# Patient Record
Sex: Female | Born: 2005
Health system: Southern US, Community
[De-identification: ages and names within clinical notes are randomized; demographics above are authoritative.]

## PROBLEM LIST (undated history)

## (undated) DIAGNOSIS — F909 Attention-deficit hyperactivity disorder, unspecified type: Secondary | ICD-10-CM

## (undated) DIAGNOSIS — J45909 Unspecified asthma, uncomplicated: Secondary | ICD-10-CM

---
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
## 2023-09-08 NOTE — Progress Notes (Signed)
 Hematology/Oncology Follow-up Visit   Patient Name:  Stacy Sims  Date of Birth:  [birth year]  Date of Encounter:  09/11/2023     Referring Provider:  Annis Lauraine Lamer,*,  [phone]    PCP:  Annis Lauraine Lamer, PA-C       Diagnoses: Iron Deficiency Anemia, Hemoglobin C        Hematologic/Oncologic History:  Stacy Sims is a 18 y.o. with  Iron deficiency anemia.She denies a family history of blood issues. The patient mother attends visits. Her mother reports that the patient has the sickle cell trait. She confirms she takes oral iron. Patient has hemoglobin C.       Interim Note:    [name] returns today for follow up of Iron Deficiency Anemia. She is feeling good today The patient denies any symptoms such as fever, chills, night sweats, chest pain, shortness of breath, constipation, or diarrhea. Her ECOG performance status is 0. Patient reports that her periods are about the same. No new concerns to report today.     Review of Systems:  All other systems are negative.   Current Outpatient Medications  Medication Sig Dispense Refill  . cetirizine  (CHILDREN'S ALL-DAY ALLERGY) 5 mg/5 mL soln Take 10 mg by mouth every evening. 120 mL 0  . ferrous sulfate 325 mg (65 mg iron) tablet Take 325 mg by mouth 2 (two) times a day.    . ibuprofen (MOTRIN) 100 mg/5 mL suspension Take  by mouth.    . triamcinolone (KENALOG) 0.1 % cream Apply to affected areas twice daily (Patient not taking: Reported on 07/10/2023) 4,536 g 2   No current facility-administered medications for this visit.     Past medical history, allergies, current medications, social history and family history were reviewed and updated when appropriate.    Physical Examination:  Vital Signs: There were no vitals taken for this visit.  General:  Pleasant female in no acute distress  HEENT: Normocephalic, atraumatic. PERRLA, EOMI, sclera anicteric.  Nose without discharge.  Mouth and lips showed no  lesions; oral mucosa is moist.  Neck supple without masses.  Lymphatic/Immunologic:  No cervical, axillary, or femoral adenopathy.   Cardiovascular:  Regular rate and rhythm, no audible murmurs, gallops, or rubs.  No JVD noted on examination.    Respiratory:  Chest clear to percussion and auscultation bilaterally; no respiratory distress. Patient speaks in complete sentences.  Gastrointestinal:  Abdomen soft and without tenderness; no hepatosplenomegaly or other masses. No clinical ascites.  Extremities:  No edema or suspicious rashes.   Skin:  No pathologic appearing petechiae or bruising noted.  Neurologic: Alert and oriented to person, place, time and circumstance.  Strength and sensation are grossly intact.  Cranial nerves III through XII grossly intact.   Psychiatric:    Mood and affect are normal. Speech is fluent.    Results:   Results for orders placed or performed in visit on 09/11/23  CBC with Differential   Collection Time: 09/11/23 12:37 PM  Result Value Ref Range   WBC 8.92 4.40 - 11.00 10*3/uL   RBC 5.65 (H) 4.10 - 5.10 10*6/uL   Hemoglobin 12.7 12.3 - 15.3 g/dL   Hematocrit 61.0 64.0 - 44.6 %   Mean Corpuscular Volume (MCV) 69.0 (L) 80.0 - 96.0 fL   Mean Corpuscular Hemoglobin (MCH) 22.5 (L) 27.5 - 33.2 pg   Mean Corpuscular Hemoglobin Conc (MCHC) 32.6 (L) 33.0 - 37.0 g/dL   Red Cell Distribution Width (RDW)  17.7 (H) 12.3 - 17.0 %   Platelet Count (PLT) 267 150 - 450 10*3/uL   Mean Platelet Volume (MPV) 10.0 6.8 - 10.2 fL   Neutrophils % 52 %   Lymphocytes % 40 %   Monocytes % 5 %   Eosinophils % 2 %   Basophils % 1 %   Neutrophils Absolute 4.60 1.80 - 7.80 10*3/uL   Lymphocytes # 3.60 1.00 - 4.80 10*3/uL   Monocytes # 0.40 0.00 - 0.80 10*3/uL   Eosinophils # 0.20 0.00 - 0.50 10*3/uL   Basophils # 0.10 0.00 - 0.20 10*3/uL        Assessment and Plan:    Iron Deficiency Anemia - Hgb was 10.0, Ferritin was 24 with an iron percent saturation of  5% on 02/21/2022.  Patient has hemoglobin C. Continue to take iron twice daily.   Hgb is 12.7 today. Checking iron stores today.     Reviewed the significance of lab findings and she has a better understanding of it.  Family was with her to discuss her situation as well and have a good understanding of when it would be appropriate to get IV iron versus what she is right now and when  she feels quite well. Her hemoglobin is relatively maintained.      RTC 6 months, she can call earlier if she starts feeling fatigued.    In total, 20 minutes of time was spent on this encounter, greater than 50% of which was spent face-to-face with  Shemia R Flater for the history, physical exam, assessment and formulation of treatment plan.   This document serves as a record of services personally performed by Aida Lunger, MD. It was created on their behalf by Eveleen Elsie Hue, Scribe, a trained medical scribe. The creation of this record is the provider's dictation and/or activities during the visit.   Electronically signed by: Eveleen Elsie Hue, Scribe 09/11/2023 1:23 PM  I agree the documentation is accurate and complete.  Electronically signed by: Aida JONELLE Lunger, MD 09/30/2023 1:18 PM

## 2023-10-27 ENCOUNTER — Emergency Department (HOSPITAL_BASED_OUTPATIENT_CLINIC_OR_DEPARTMENT_OTHER)
Admission: EM | Admit: 2023-10-27 | Discharge: 2023-10-27 | Disposition: A | Discharge status: Home / Self Care | Attending: Emergency Medicine | Admitting: Emergency Medicine

## 2023-10-27 ENCOUNTER — Encounter (HOSPITAL_BASED_OUTPATIENT_CLINIC_OR_DEPARTMENT_OTHER): Payer: Self-pay | Admitting: *Deleted

## 2023-10-27 ENCOUNTER — Emergency Department (HOSPITAL_BASED_OUTPATIENT_CLINIC_OR_DEPARTMENT_OTHER)

## 2023-10-27 ENCOUNTER — Other Ambulatory Visit: Payer: Self-pay

## 2023-10-27 DIAGNOSIS — J0110 Acute frontal sinusitis, unspecified: Secondary | ICD-10-CM | POA: Diagnosis not present

## 2023-10-27 DIAGNOSIS — R059 Cough, unspecified: Secondary | ICD-10-CM | POA: Diagnosis present

## 2023-10-27 LAB — RESP PANEL BY RT-PCR (RSV, FLU A&B, COVID)  RVPGX2
Influenza A by PCR: NEGATIVE
Influenza B by PCR: NEGATIVE
Resp Syncytial Virus by PCR: NEGATIVE
SARS Coronavirus 2 by RT PCR: NEGATIVE

## 2023-10-27 MED ORDER — AMOXICILLIN-POT CLAVULANATE 875-125 MG PO TABS: 1.0000 | ORAL_TABLET | Freq: Two times a day (BID) | ORAL | 0 refills | Status: AC

## 2023-10-27 NOTE — Discharge Instructions (Signed)
 Return if any problems.

## 2023-10-27 NOTE — ED Triage Notes (Signed)
 Here by POV from home for cough, describes as productive with mucus, noticed blood, ongoing > 2 weeks, works in daycare. Reports ILI 2 weeks ago, sx resolved but cough lingers. Denies fever, NVD, or pain.

## 2023-10-27 NOTE — ED Provider Notes (Signed)
 [place] [facility] AT [facility] Provider Note   CSN: [id number] Arrival date & time: 10/27/23  1731     History  Chief Complaint  Patient presents with   Cough    Stacy Sims is a 18 y.o. female.  Patient reports that she had a cold and congestion 2 weeks ago.  Patient reports that she went to the beach and symptoms resolved.  Patient reports for the past week she has had increased sinus congestion and pressure.  Patient reports her nose has been draining.  Patient denies any chest congestion.  Patient has had limited coughing.  Patient denies any fever or chills  The history is provided by the patient. No language interpreter was used.  Cough Cough characteristics:  Non-productive      Home Medications Prior to Admission medications   Medication Sig Start Date End Date Taking? Authorizing Provider  amoxicillin-clavulanate (AUGMENTIN) 875-125 MG tablet Take 1 tablet by mouth 2 (two) times daily. 10/27/23  Yes Merlean Pizzini K, PA-C  amoxicillin-clavulanate (AUGMENTIN) 875-125 MG tablet Take 1 tablet by mouth 2 (two) times daily. 10/27/23  Yes Dorothey Gate K, PA-C  albuterol  (PROVENTIL  HFA;VENTOLIN  HFA) 108 (90 Base) MCG/ACT inhaler Inhale 2 puffs into the lungs every 6 (six) hours as needed. For shortness of breath 03/05/18   Stallings, Zoe A, MD  Cetirizine HCl (ZYRTEC) 5 MG/5ML SYRP Take 5 mg by mouth daily.    [provider]      Allergies    Patient has no known allergies.    Review of Systems   Review of Systems  Respiratory:  Positive for cough.   All other systems reviewed and are negative.   Physical Exam Updated Vital Signs BP 117/75   Pulse 77   Temp 99.1 F (37.3 C) (Oral)   Resp 18   Wt 104.3 kg   LMP 09/21/2023 (Exact Date)   SpO2 100%  Physical Exam Vitals reviewed.  Constitutional:      Appearance: Normal appearance.  HENT:     Head: Normocephalic and atraumatic.     Right Ear: External ear normal.      Left Ear: External ear normal.     Nose: Nose normal.     Mouth/Throat:     Mouth: Mucous membranes are moist.  Cardiovascular:     Rate and Rhythm: Normal rate.  Pulmonary:     Effort: Pulmonary effort is normal.  Abdominal:     General: Abdomen is flat.  Musculoskeletal:        General: Normal range of motion.  Skin:    General: Skin is warm.  Neurological:     General: No focal deficit present.     Mental Status: She is alert.  Psychiatric:        Mood and Affect: Mood normal.     ED Results / Procedures / Treatments   Labs (all labs ordered are listed, but only abnormal results are displayed) Labs Reviewed  RESP PANEL BY RT-PCR (RSV, FLU A&B, COVID)  RVPGX2    EKG None  Radiology No results found.  Procedures Procedures    Medications Ordered in ED Medications - No data to display  ED Course/ Medical Decision Making/ A&P                                 Medical Decision Making Patient complains of sinus pressure and congestion.  Patient was  sick 2 weeks ago with cough and congestion she thought she was getting better now she has increased sinus pressure and drainage  Risk Prescription drug management. Risk Details: Patient counseled on sinus infection she is given a prescription for Augmentin.  Patient is advised to follow-up with her primary care physician for recheck.           Final Clinical Impression(s) / ED Diagnoses Final diagnoses:  Acute frontal sinusitis, recurrence not specified    Rx / DC Orders ED Discharge Orders          Ordered    amoxicillin-clavulanate (AUGMENTIN) 875-125 MG tablet  2 times daily        10/27/23 1933    amoxicillin-clavulanate (AUGMENTIN) 875-125 MG tablet  2 times daily        10/27/23 1937           An After Visit Summary was printed and given to the patient.    Sandi Crosby, PA-C 10/27/23 2323    Afton Horse T, DO 10/30/23 [phone]

## 2023-12-22 NOTE — Progress Notes (Signed)
 Patient presents for  Chief Complaint  Patient presents with  . Possible URI  . Follow-up   Stacy Sims presents to primary care clinic for ongoing management of Possible URI  SUBJECTIVE   Pt is present addressing possible URI symptoms x 3 days.  Symptoms:Cough/tickle,sore throat,slight runny nose Denies:Nausea,dizziness,vomiting,diarrhea, body aches, fever,headache,wheezing,SOB,postnasal drainage,sinus pressure,coughing up mucus of any kind/color,fatigue,head/nasal congestion. Duration:3 Days Medications Tried: Zyrtec ,Bromphen/Pseudo/Dext RO HBR syrup 2-30-10 mg/5 ml syrp Ill Contacts: Possible-Works at [employer] Pulmonary Disease: Mild Intermittent Asthma Without Complication Tobacco Use :   No History Of Seasonal Allergies,History of Chronic Sinusitis Pt declines additional testing at this time.  New Problems/Concerns:  None at this time.  Allergies[1]  Current Medications[2]  The following portions of the patient's history were reviewed and updated as appropriate: allergies, current medications, past medical history, past social history, past family history, and problem list.   ROS   See HPI.  OBJECTIVE  BP 128/82 (BP Location: Left arm, Patient Position: Sitting)   Pulse 84   Temp 100 F (37.8 C) (Oral)   Ht 1.715 m (5' 7.5)   Wt 105 kg (231 lb 9.6 oz)   SpO2 98%   BMI 35.74 kg/m   GENERAL APPEARANCE: Well appearing, well developed, no acute distress. HEENT:  Conjunctiva without erythema or drainage. External auditory canals are non-swollen without exudate present. Tympanic membranes are normal. Nasal turbinates are without edema or drainage. Oropharynx is without lesion or exudate.       NECK: Supple without lymphadenopathy, bruit, or thyromegaly. LUNGS: Clear to auscultation bilaterally HEART: Regular rate and rhythm without murmur. EXTREMITIES: No edema NEUROLOGIC: Alert and oriented x3, no obvious deficits.  SKIN:  No rashes or abnormal lesions  visible.   ASSESSMENT/PLAN   1. Pharyngitis, unspecified etiology (Primary) Due to duration and severity of symptoms, suspect viral URI, less likely bacterial.  Will provide symptomatic treatment with prednisone taper and tessalon  Encouraged rest, increased fluid intake, Mucinex/flonase for congestion and Tylenol  for pain/fevers.  Contact the office if symptoms change or worsen may consider antibiotics at that time. Explained that cough may linger for up to 2 weeks.  - predniSONE (DELTASONE) 10 mg tablet; Take 6 tablets PO day 1, 5 tablets day 2, 4 tablets day 3, 3 tablets day 4, 2 tablets day 5, 1 tablet day 6  Dispense: 21 tablet; Refill: 0 - benzonatate (TESSALON) 200 mg capsule; Take 1 capsule (200 mg total) by mouth 3 (three) times a day as needed for cough.  Dispense: 30 capsule; Refill: 0  Follow up if symptoms fail to improve or worsen.   This document serves as a record of services personally performed by Odis Petite, FNP.  It was created on their behalf by Kenney JONELLE Diver, CMA, a trained medical scribe, and Certified Medical Assistant (CMA). During the course of documenting the history, physical exam and medical decision making, I was functioning as a Stage manager. The creation of this record is the provider's dictation and/or activities during the visit.  Electronically signed by Kenney JONELLE Diver, CMA 12/22/2023 2:51 PM  I agree the documentation is accurate and complete.   Electronically signed by: Morene Eck Roddy, FNP [place] 12/22/2023 2:52 PM       [1] No Known Allergies [2]  Current Outpatient Medications:  .  cetirizine  (CHILDREN'S ALL-DAY ALLERGY) 5 mg/5 mL soln, Take 10 mg by mouth every evening., Disp: 120 mL, Rfl: 0 .  ferrous sulfate 325 mg (65 mg iron) tablet, Take 325 mg  by mouth 2 (two) times a day., Disp: , Rfl:  .  ibuprofen (MOTRIN) 100 mg/5 mL suspension, Take  by mouth., Disp: , Rfl:  .  triamcinolone (KENALOG) 0.1 % cream, Apply to affected areas  twice daily, Disp: 4,536 g, Rfl: 2 .  benzonatate (TESSALON) 200 mg capsule, Take 1 capsule (200 mg total) by mouth 3 (three) times a day as needed for cough., Disp: 30 capsule, Rfl: 0 .  predniSONE (DELTASONE) 10 mg tablet, Take 6 tablets PO day 1, 5 tablets day 2, 4 tablets day 3, 3 tablets day 4, 2 tablets day 5, 1 tablet day 6, Disp: 21 tablet, Rfl: 0

## 2023-12-23 ENCOUNTER — Other Ambulatory Visit: Payer: Self-pay

## 2023-12-23 ENCOUNTER — Emergency Department (HOSPITAL_BASED_OUTPATIENT_CLINIC_OR_DEPARTMENT_OTHER)
Admission: EM | Admit: 2023-12-23 | Discharge: 2023-12-23 | Disposition: A | Discharge status: Home / Self Care | Attending: Emergency Medicine | Admitting: Emergency Medicine

## 2023-12-23 ENCOUNTER — Emergency Department (HOSPITAL_BASED_OUTPATIENT_CLINIC_OR_DEPARTMENT_OTHER)

## 2023-12-23 ENCOUNTER — Encounter (HOSPITAL_BASED_OUTPATIENT_CLINIC_OR_DEPARTMENT_OTHER): Payer: Self-pay | Admitting: Emergency Medicine

## 2023-12-23 DIAGNOSIS — J069 Acute upper respiratory infection, unspecified: Secondary | ICD-10-CM | POA: Insufficient documentation

## 2023-12-23 DIAGNOSIS — R059 Cough, unspecified: Secondary | ICD-10-CM | POA: Diagnosis present

## 2023-12-23 LAB — RESP PANEL BY RT-PCR (RSV, FLU A&B, COVID)  RVPGX2
Influenza A by PCR: NEGATIVE
Influenza B by PCR: NEGATIVE
Resp Syncytial Virus by PCR: NEGATIVE
SARS Coronavirus 2 by RT PCR: NEGATIVE

## 2023-12-23 MED ORDER — CETIRIZINE HCL 10 MG PO TABS: 10.0000 mg | ORAL_TABLET | Freq: Every day | ORAL | 1 refills | Status: AC

## 2023-12-23 NOTE — ED Triage Notes (Signed)
 Cough x 1 week saw the dr yesterday given meds but not antibioticsm states cough is worse at night had a sore throat but now it is gone

## 2023-12-23 NOTE — Discharge Instructions (Signed)
 1.  Continue to use your inhaler every 4-6 hours as needed for cough or wheeze.  You may use this before you go to sleep at night.  Asthma often causes more cough and wheezing at nighttime.  You should follow-up with your family doctor for additional asthma testing.  If you have chronic asthma, you may need regular daily medications to control symptoms.  Albuterol  is helpful for flareups but chronic asthma benefits from daily treatment.  You do not have wheezing at the time of your evaluation today, however symptoms such as chronic nighttime cough or cough with exercise can be due to asthma. 2.  Finish the steroids as prescribed from urgent care.  You can continue to take over-the-counter nighttime cough and cold medications as needed carefully following package instructions and not combining medications. 3.  Schedule a follow-up with your family doctor.  Return to the emergency department if you develop a fever, chest pain, shortness of breath or other concerning symptoms.

## 2023-12-23 NOTE — ED Notes (Signed)
 Patient transported to X-ray

## 2023-12-23 NOTE — ED Provider Notes (Signed)
 [place] [facility] Provider Note   CSN: [id number] Arrival date & time: 12/23/23  9247     Patient presents with: Cough   Stacy Sims is a 18 y.o. female.   HPI Patient reports she has a sore throat that started about a week ago.  Initially she had some pain with swallowing.  Then she started to develop a cough.  Patient reports she does not think she has had a fever with the symptoms.  She reports that the sore throat has improved and she was mostly having with cough but now that seems to be about gone.  The symptom that is really persisting is nighttime cough.  Patient reports that all of the symptoms get worse at night.  They seem to be improving during the day.  Patient reports she does have a history of asthma but was unaware that she could use the inhaler for wheezing.  If she has not tried the inhaler at nighttime.  She reports now that she is aware that she will be trying it.  Patient denies any chest pain or abdominal pain.  No lower extremity swelling or calf pain.    Prior to Admission medications   Medication Sig Start Date End Date Taking? Authorizing Provider  cetirizine  (ZYRTEC  ALLERGY) 10 MG tablet Take 1 tablet (10 mg total) by mouth daily. 12/23/23  Yes Armenta Canning, MD  albuterol  (PROVENTIL  HFA;VENTOLIN  HFA) 108 (90 Base) MCG/ACT inhaler Inhale 2 puffs into the lungs every 6 (six) hours as needed. For shortness of breath 03/05/18   Stallings, Zoe A, MD  amoxicillin -clavulanate (AUGMENTIN ) 875-125 MG tablet Take 1 tablet by mouth 2 (two) times daily. 10/27/23   Sofia, Leslie K, PA-C  amoxicillin -clavulanate (AUGMENTIN ) 875-125 MG tablet Take 1 tablet by mouth 2 (two) times daily. 10/27/23   Sofia, Leslie K, PA-C  Cetirizine  HCl (ZYRTEC ) 5 MG/5ML SYRP Take 5 mg by mouth daily.    [provider]    Allergies: Patient has no known allergies.    Review of Systems  Updated Vital Signs BP 132/68 (BP Location: Right Arm)    Pulse 92   Temp 99.1 F (37.3 C) (Oral)   Resp 20   Ht 5' 7 (1.702 m)   Wt 104.3 kg   SpO2 100%   BMI 36.02 kg/m   Physical Exam Constitutional:      Comments: Patient is alert and nontoxic.  No respiratory distress.  Mental status clear.  HENT:     Right Ear: Tympanic membrane normal.     Left Ear: Tympanic membrane normal.     Nose: Nose normal.     Mouth/Throat:     Mouth: Mucous membranes are moist.     Pharynx: Oropharynx is clear.     Comments: Posterior oropharynx is widely patent.  There is no erythema or exudate on the tonsillar pillars.  Uvula is midline. Eyes:     Extraocular Movements: Extraocular movements intact.     Pupils: Pupils are equal, round, and reactive to light.  Cardiovascular:     Rate and Rhythm: Normal rate and regular rhythm.  Pulmonary:     Effort: Pulmonary effort is normal.     Breath sounds: Normal breath sounds.  Abdominal:     General: There is no distension.     Palpations: Abdomen is soft.     Tenderness: There is no abdominal tenderness. There is no guarding.  Musculoskeletal:        General:  No swelling or tenderness. Normal range of motion.     Cervical back: Neck supple.     Right lower leg: No edema.     Left lower leg: No edema.     Comments: No calf tenderness to compression.  No peripheral edema.  Skin:    General: Skin is warm and dry.  Neurological:     General: No focal deficit present.     Mental Status: She is oriented to person, place, and time.     Motor: No weakness.     Coordination: Coordination normal.  Psychiatric:        Mood and Affect: Mood normal.     (all labs ordered are listed, but only abnormal results are displayed) Labs Reviewed  RESP PANEL BY RT-PCR (RSV, FLU A&B, COVID)  RVPGX2    EKG: None  Radiology: DG Chest 2 View Result Date: 12/23/2023 CLINICAL DATA:  Cough for 1 week. EXAM: CHEST - 2 VIEW COMPARISON:  06/08/2011 FINDINGS: The heart size and mediastinal contours are within  normal limits. Both lungs are clear. The visualized skeletal structures are unremarkable. IMPRESSION: No active cardiopulmonary disease. Electronically Signed   By: Waddell Calk M.D.   On: 12/23/2023 08:57     Procedures   Medications Ordered in the ED - No data to display                                  Medical Decision Making Amount and/or Complexity of Data Reviewed Radiology: ordered.  Risk OTC drugs.   Patient has had about a weeks worth of symptoms starting with sore throat and then followed by cough with wheezing.  Patient reports a history of asthma but is controlled with occasional use of inhalers.  At this time she is clinically well in appearance with a normal physical exam.  Patient reports she is seeing improvement of her symptoms but they recrudesce and are much more intense at night when she is trying to sleep.  Patient was seen yesterday and started empirically on prednisone and Tessalon pearls.  Patient was also counseled on increased use of her inhaler.  Patient reports that she did get the prednisone but opted not to pay for the [facility], so has not had the option to try them.  Will obtain chest x-ray for cough of approximately week to rule out any appearance of pneumonia.  Chest x-ray reviewed by radiology negative.  Respiratory swab for COVID influenza RSV negative.  Patient is clinically well at this time.  She does not have active wheezing on exam today.  Presentation continues to be consistent with viral illness.  Patient does report a history of asthma.  We discussed asthma as a potential cause for nighttime cough.  It does sound as though symptoms have been exacerbated by acute illness although patient is clinically well.  She was prescribed prednisone from urgent care which I advised to continue.  Patient was not aware that she can use the inhaler at night for nighttime cough.  I have refilled Zyrtec  for the patient.  Recommendations include further  evaluation by PCP for asthma in case nighttime cough is asthmatic rather than viral.  We also reviewed return precautions for symptoms of any type of bacterial illness including chest pain, fever, shortness of breath.  Patient voices understanding.     Final diagnoses:  Viral URI with cough    ED Discharge Orders  Ordered    cetirizine  (ZYRTEC  ALLERGY) 10 MG tablet  Daily        12/23/23 9061               Armenta Canning, MD 12/23/23 [phone]

## 2024-03-08 ENCOUNTER — Other Ambulatory Visit: Payer: Self-pay

## 2024-03-08 ENCOUNTER — Emergency Department (HOSPITAL_BASED_OUTPATIENT_CLINIC_OR_DEPARTMENT_OTHER)
Admission: EM | Admit: 2024-03-08 | Discharge: 2024-03-08 | Disposition: A | Discharge status: Home / Self Care | Attending: Emergency Medicine | Admitting: Emergency Medicine

## 2024-03-08 ENCOUNTER — Encounter (HOSPITAL_BASED_OUTPATIENT_CLINIC_OR_DEPARTMENT_OTHER): Payer: Self-pay | Admitting: Emergency Medicine

## 2024-03-08 DIAGNOSIS — J069 Acute upper respiratory infection, unspecified: Secondary | ICD-10-CM | POA: Insufficient documentation

## 2024-03-08 DIAGNOSIS — R059 Cough, unspecified: Secondary | ICD-10-CM | POA: Diagnosis present

## 2024-03-08 LAB — RESP PANEL BY RT-PCR (RSV, FLU A&B, COVID)  RVPGX2
Influenza A by PCR: NEGATIVE
Influenza B by PCR: NEGATIVE
Resp Syncytial Virus by PCR: NEGATIVE
SARS Coronavirus 2 by RT PCR: NEGATIVE

## 2024-03-08 LAB — GROUP A STREP BY PCR: Group A Strep by PCR: NOT DETECTED

## 2024-03-08 MED ORDER — AMOXICILLIN-POT CLAVULANATE 875-125 MG PO TABS: 1.0000 | ORAL_TABLET | Freq: Two times a day (BID) | ORAL | 0 refills | Status: AC

## 2024-03-08 MED ORDER — AMOXICILLIN-POT CLAVULANATE 875-125 MG PO TABS
1.0000 | ORAL_TABLET | Freq: Once | ORAL | Status: AC
  Administered 2024-03-08: 1 via ORAL
  Filled 2024-03-08: qty 1

## 2024-03-08 NOTE — ED Provider Notes (Signed)
 [place] [facility] Provider Note   CSN: [id number] Arrival date & time: 03/08/24  1725     Patient presents with: URI and Sore Throat   Stacy Sims is a 18 y.o. female who presents emergency department with a chief complaint of URI symptoms.  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  3 weeks ago she developed URI symptoms including runny nose cough sore throat.  She states that she was getting better but she suddenly got sick again in the last 24 hours.  She denies any fevers or chills.    URI Sore Throat       Prior to Admission medications   Medication Sig Start Date End Date Taking? Authorizing Provider  albuterol  (PROVENTIL  HFA;VENTOLIN  HFA) 108 (90 Base) MCG/ACT inhaler Inhale 2 puffs into the lungs every 6 (six) hours as needed. For shortness of breath 03/05/18   Stallings, Zoe A, MD  amoxicillin -clavulanate (AUGMENTIN ) 875-125 MG tablet Take 1 tablet by mouth 2 (two) times daily. 10/27/23   Sofia, Leslie K, PA-C  amoxicillin -clavulanate (AUGMENTIN ) 875-125 MG tablet Take 1 tablet by mouth 2 (two) times daily. 10/27/23   Flint Sonny POUR, PA-C  cetirizine  (ZYRTEC  ALLERGY) 10 MG tablet Take 1 tablet (10 mg total) by mouth daily. 12/23/23   Armenta Canning, MD  Cetirizine  HCl (ZYRTEC ) 5 MG/5ML SYRP Take 5 mg by mouth daily.    [provider]    Allergies: Patient has no known allergies.    Review of Systems  Updated Vital Signs BP 129/79   Pulse 75   Temp 98.4 F (36.9 C) (Oral)   Resp (!) 24   Ht 5' 7 (1.702 m)   Wt 97.1 kg   LMP 02/27/2024 (Exact Date)   SpO2 100%   BMI 33.52 kg/m   Physical Exam Vitals and nursing note reviewed.  Constitutional:      General: She is not in acute distress.    Appearance: She is well-developed. She is ill-appearing. She is not toxic-appearing or diaphoretic.  HENT:     Head: Normocephalic and atraumatic.     Right Ear: External ear normal.     Left Ear: External ear  normal.     Nose: Congestion and rhinorrhea present.     Mouth/Throat:     Mouth: Mucous membranes are moist.  Eyes:     General: No scleral icterus.    Conjunctiva/sclera: Conjunctivae normal.  Cardiovascular:     Rate and Rhythm: Normal rate and regular rhythm.     Heart sounds: Normal heart sounds. No murmur heard.    No friction rub. No gallop.  Pulmonary:     Effort: Pulmonary effort is normal. No respiratory distress.     Breath sounds: Normal breath sounds.  Abdominal:     General: Bowel sounds are normal. There is no distension.     Palpations: Abdomen is soft. There is no mass.     Tenderness: There is no abdominal tenderness. There is no guarding.  Musculoskeletal:     Cervical back: Normal range of motion.  Skin:    General: Skin is warm and dry.  Neurological:     Mental Status: She is alert and oriented to person, place, and time.  Psychiatric:        Behavior: Behavior normal.     (all labs ordered are listed, but only abnormal results are displayed) Labs Reviewed  GROUP A STREP BY PCR  RESP PANEL BY RT-PCR (RSV, FLU  A&B, COVID)  RVPGX2    EKG: None  Radiology: No results found.   Procedures   Medications Ordered in the ED - No data to display                                  Medical Decision Making Risk Prescription drug management.   Patient here with URI symptoms.  Group A strep and respiratory panel are negative.  Though she may have reinfection with a separate virus due to the potential for double sickening will cover with an antibiotic.  Patient be discharged with Augmentin , first dose given.  She is otherwise afebrile hemodynamically stable without any hypoxia and appears appropriate for discharge at this time.     Final diagnoses:  None    ED Discharge Orders     None          Arloa Chroman, PA-C 03/08/24 2111    Geraldene Hamilton, MD 03/11/24 [phone]

## 2024-03-08 NOTE — ED Triage Notes (Signed)
 Pt c/o no productive cough x 3 weeks with sore throat. Denies known fever.  Pt works at a daycare.

## 2024-03-08 NOTE — Discharge Instructions (Signed)
 You appear to have an upper respiratory infection (URI). An upper respiratory tract infection, or cold, is a viral infection of the air passages leading to the lungs. It is contagious and can be spread to others, especially during the first 3 or 4 days. Due to the fact that you were recently sick got better and then got very sick again we were going to cover with an antibiotic to make sure recovering for bacterial infections.  Follow-up with your primary care physician as soon as possible. RETURN IMMEDIATELY IF you develop shortness of breath, confusion or altered mental status, a new rash, become dizzy, faint, or poorly responsive, or are unable to be cared for at home.
